# Patient Record
Sex: Male | Born: 1935 | Race: White | Hispanic: No | Marital: Single | Smoking: Former smoker
Health system: Southern US, Community
[De-identification: ages and names within clinical notes are randomized; demographics above are authoritative.]

## PROBLEM LIST (undated history)

## (undated) DIAGNOSIS — G4730 Sleep apnea, unspecified: Secondary | ICD-10-CM

## (undated) DIAGNOSIS — G629 Polyneuropathy, unspecified: Secondary | ICD-10-CM

## (undated) DIAGNOSIS — I10 Essential (primary) hypertension: Secondary | ICD-10-CM

## (undated) DIAGNOSIS — T7840XA Allergy, unspecified, initial encounter: Secondary | ICD-10-CM

## (undated) DIAGNOSIS — J45909 Unspecified asthma, uncomplicated: Secondary | ICD-10-CM

## (undated) DIAGNOSIS — E119 Type 2 diabetes mellitus without complications: Secondary | ICD-10-CM

## (undated) DIAGNOSIS — E7800 Pure hypercholesterolemia, unspecified: Secondary | ICD-10-CM

## (undated) DIAGNOSIS — H409 Unspecified glaucoma: Secondary | ICD-10-CM

## (undated) DIAGNOSIS — Z5189 Encounter for other specified aftercare: Secondary | ICD-10-CM

## (undated) DIAGNOSIS — E079 Disorder of thyroid, unspecified: Secondary | ICD-10-CM

## (undated) DIAGNOSIS — D649 Anemia, unspecified: Secondary | ICD-10-CM

## (undated) DIAGNOSIS — K219 Gastro-esophageal reflux disease without esophagitis: Secondary | ICD-10-CM

## (undated) HISTORY — DX: Unspecified glaucoma: H40.9

## (undated) HISTORY — PX: COLONOSCOPY: SHX174

## (undated) HISTORY — PX: BACK SURGERY: SHX140

## (undated) HISTORY — PX: LEG SURGERY: SHX1003

## (undated) HISTORY — DX: Anemia, unspecified: D64.9

## (undated) HISTORY — DX: Disorder of thyroid, unspecified: E07.9

## (undated) HISTORY — DX: Encounter for other specified aftercare: Z51.89

## (undated) HISTORY — PX: CATARACT EXTRACTION, BILATERAL: SHX1313

## (undated) HISTORY — DX: Pure hypercholesterolemia, unspecified: E78.00

## (undated) HISTORY — DX: Gastro-esophageal reflux disease without esophagitis: K21.9

## (undated) HISTORY — PX: OTHER SURGICAL HISTORY: SHX169

## (undated) HISTORY — DX: Sleep apnea, unspecified: G47.30

## (undated) HISTORY — DX: Essential (primary) hypertension: I10

## (undated) HISTORY — DX: Unspecified asthma, uncomplicated: J45.909

## (undated) HISTORY — DX: Polyneuropathy, unspecified: G62.9

## (undated) HISTORY — DX: Allergy, unspecified, initial encounter: T78.40XA

## (undated) HISTORY — DX: Type 2 diabetes mellitus without complications: E11.9

---

## 1999-09-04 ENCOUNTER — Encounter: Payer: Self-pay | Admitting: Internal Medicine

## 2006-03-17 ENCOUNTER — Encounter: Payer: Self-pay | Admitting: Internal Medicine

## 2009-03-13 ENCOUNTER — Encounter: Payer: Self-pay | Admitting: Internal Medicine

## 2016-07-11 ENCOUNTER — Encounter (INDEPENDENT_AMBULATORY_CARE_PROVIDER_SITE_OTHER): Payer: Self-pay | Admitting: Internal Medicine

## 2016-08-07 ENCOUNTER — Ambulatory Visit (INDEPENDENT_AMBULATORY_CARE_PROVIDER_SITE_OTHER): Payer: Medicare Other | Admitting: Internal Medicine

## 2016-08-08 ENCOUNTER — Ambulatory Visit (INDEPENDENT_AMBULATORY_CARE_PROVIDER_SITE_OTHER): Payer: Medicare Other | Admitting: Internal Medicine

## 2016-08-22 ENCOUNTER — Encounter (INDEPENDENT_AMBULATORY_CARE_PROVIDER_SITE_OTHER): Payer: Self-pay | Admitting: Internal Medicine

## 2016-08-22 ENCOUNTER — Encounter (INDEPENDENT_AMBULATORY_CARE_PROVIDER_SITE_OTHER): Payer: Self-pay

## 2016-08-22 ENCOUNTER — Ambulatory Visit (INDEPENDENT_AMBULATORY_CARE_PROVIDER_SITE_OTHER): Payer: Medicare Other | Admitting: Internal Medicine

## 2016-08-22 VITALS — BP 178/80 | HR 80 | Temp 98.2°F | Ht 71.0 in | Wt 181.3 lb

## 2016-08-22 DIAGNOSIS — E7800 Pure hypercholesterolemia, unspecified: Secondary | ICD-10-CM | POA: Insufficient documentation

## 2016-08-22 DIAGNOSIS — D509 Iron deficiency anemia, unspecified: Secondary | ICD-10-CM | POA: Insufficient documentation

## 2016-08-22 DIAGNOSIS — I10 Essential (primary) hypertension: Secondary | ICD-10-CM | POA: Diagnosis not present

## 2016-08-22 DIAGNOSIS — D508 Other iron deficiency anemias: Secondary | ICD-10-CM

## 2016-08-22 DIAGNOSIS — H409 Unspecified glaucoma: Secondary | ICD-10-CM | POA: Insufficient documentation

## 2016-08-22 NOTE — Progress Notes (Signed)
Subjective:    Patient ID: Duane Kelly, male    DOB: 1932-04-12, 83 y.o.   MRN: [id number]  HPI Referred by Dr. Madelyn Flavors for  anemia. He takes iron daily. He has acid reflux. Occasionally has acid reflux if he eats spicy foods.  He denies any dysphagia. He denies seeing any blood in his stool. There has been no weight loss. Appetite is good.  He has a BM daily and sometimes two.  No melena or BRRB. He says his stools are dark because of the iron.   Per records, last colonoscopy in 2010 and was normal. Dr. Aleene Davidson.  10 year f/u recommended. Diabetic for over 20 yrs.    Review of Systems Past Medical History:  Diagnosis Date  . Diabetes (HCC)   . High blood pressure   . High cholesterol     No past surgical history on file.  Allergies no known allergies  No current outpatient prescriptions on file prior to visit.   No current facility-administered medications on file prior to visit.    Current Outpatient Prescriptions  Medication Sig Dispense Refill  . amLODipine (NORVASC) 5 MG tablet Take 5 mg by mouth daily.    . benazepril-hydrochlorthiazide (LOTENSIN HCT) 20-25 MG tablet Take 1 tablet by mouth daily.    . Biotin 1 MG CAPS Take by mouth.    . budesonide-formoterol (SYMBICORT) 80-4.5 MCG/ACT inhaler Inhale 2 puffs into the lungs 2 (two) times daily.    . Cholecalciferol (VITAMIN D3) 1000 units CAPS Take 5,000 Units by mouth daily.    Marland Kitchen dipyridamole (PERSANTINE) 50 MG tablet Take 50 mg by mouth 4 (four) times daily.    Marland Kitchen DOXAZOSIN MESYLATE ER PO Take 8 mg by mouth.    . ferrous sulfate 325 (65 FE) MG tablet Take 325 mg by mouth daily with breakfast.    . finasteride (PROSCAR) 5 MG tablet Take 5 mg by mouth daily.    Marland Kitchen gabapentin (NEURONTIN) 100 MG capsule Take 100 mg by mouth as needed.    . Garlic 10 MG CAPS Take by mouth as needed.    Marland Kitchen HYDROcodone-acetaminophen (NORCO) 7.5-325 MG tablet Take 1 tablet by mouth every 6 (six) hours as needed for moderate pain.      Marland Kitchen LORazepam (ATIVAN) 1 MG tablet Take 1 mg by mouth 2 (two) times daily.    . metFORMIN (GLUCOPHAGE) 500 MG tablet Take by mouth 3 (three) times daily.    . montelukast (SINGULAIR) 10 MG tablet Take 10 mg by mouth at bedtime.    . Multiple Vitamins-Minerals (CENTRUM SILVER 50+MEN PO) Take by mouth.    Marland Kitchen omeprazole (PRILOSEC) 20 MG capsule Take 20 mg by mouth 2 (two) times daily before a meal.    . potassium chloride SA (KLOR-CON M15) 15 MEQ tablet Take 20 mEq by mouth 2 (two) times daily.    . pravastatin (PRAVACHOL) 20 MG tablet Take 20 mg by mouth daily.    . traMADol (ULTRAM) 50 MG tablet Take by mouth every 6 (six) hours as needed.    . vitamin B-12 (CYANOCOBALAMIN) 100 MCG tablet Take by mouth daily.    Marland Kitchen zolpidem (AMBIEN) 10 MG tablet Take 10 mg by mouth at bedtime as needed for sleep.     No current facility-administered medications for this visit.         Objective:   Physical Exam Blood pressure (!) 178/80, pulse 80, temperature 98.2 F (36.8 C), height 5\' 11"  (1.803 m), weight 181 lb  4.8 oz (82.2 kg).  Alert and oriented. Skin warm and dry. Oral mucosa is moist.   . Sclera anicteric, conjunctivae is pink. Thyroid not enlarged. No cervical lymphadenopathy. Lungs clear. Heart regular rate and rhythm.  Abdomen is soft. Bowel sounds are positive. No hepatomegaly. No abdominal masses felt. No tenderness.  No edema to lower extremities.  Stool brown and guaiac negative      Assessment & Plan:  Anemia. Am going to get iron studies, CBC.  If anemic will need an EGD/Colonoscopy.

## 2016-08-22 NOTE — Patient Instructions (Signed)
Iron studies, CBC. Further recommendations to follow.

## 2016-08-23 LAB — IRON AND TIBC
%SAT: 30 % (ref 15–60)
Iron: 89 ug/dL (ref 50–180)
TIBC: 293 ug/dL (ref 250–425)
UIBC: 204 ug/dL (ref 125–400)

## 2016-08-23 LAB — CBC WITH DIFFERENTIAL/PLATELET
BASOS PCT: 1 %
Basophils Absolute: 63 cells/uL (ref 0–200)
EOS ABS: 126 {cells}/uL (ref 15–500)
Eosinophils Relative: 2 %
HEMATOCRIT: 36.8 % — AB (ref 38.5–50.0)
HEMOGLOBIN: 12.1 g/dL — AB (ref 13.2–17.1)
LYMPHS ABS: 2394 {cells}/uL (ref 850–3900)
LYMPHS PCT: 38 %
MCH: 30.1 pg (ref 27.0–33.0)
MCHC: 32.9 g/dL (ref 32.0–36.0)
MCV: 91.5 fL (ref 80.0–100.0)
MONO ABS: 504 {cells}/uL (ref 200–950)
MPV: 8.8 fL (ref 7.5–12.5)
Monocytes Relative: 8 %
NEUTROS ABS: 3213 {cells}/uL (ref 1500–7800)
Neutrophils Relative %: 51 %
Platelets: 330 10*3/uL (ref 140–400)
RBC: 4.02 MIL/uL — AB (ref 4.20–5.80)
RDW: 13.8 % (ref 11.0–15.0)
WBC: 6.3 10*3/uL (ref 3.8–10.8)

## 2016-08-23 LAB — FERRITIN: FERRITIN: 84 ng/mL (ref 20–380)

## 2016-08-27 ENCOUNTER — Other Ambulatory Visit (INDEPENDENT_AMBULATORY_CARE_PROVIDER_SITE_OTHER): Payer: Self-pay | Admitting: Internal Medicine

## 2016-09-11 ENCOUNTER — Encounter (INDEPENDENT_AMBULATORY_CARE_PROVIDER_SITE_OTHER): Payer: Self-pay | Admitting: Internal Medicine

## 2016-09-11 NOTE — Progress Notes (Signed)
Patient was given an appointment for 12/09/16 at 10:45am with Dorene Arerri Setzer, NP.  A letter was mailed to the patient.

## 2016-09-17 ENCOUNTER — Encounter (INDEPENDENT_AMBULATORY_CARE_PROVIDER_SITE_OTHER): Payer: Self-pay

## 2016-12-09 ENCOUNTER — Ambulatory Visit (INDEPENDENT_AMBULATORY_CARE_PROVIDER_SITE_OTHER): Payer: Medicare Other | Admitting: Internal Medicine

## 2016-12-11 ENCOUNTER — Ambulatory Visit (INDEPENDENT_AMBULATORY_CARE_PROVIDER_SITE_OTHER): Payer: Medicare Other | Admitting: Internal Medicine

## 2018-04-21 ENCOUNTER — Encounter: Payer: Self-pay | Admitting: Internal Medicine

## 2018-04-30 ENCOUNTER — Ambulatory Visit (INDEPENDENT_AMBULATORY_CARE_PROVIDER_SITE_OTHER): Payer: Medicare Other | Admitting: Internal Medicine

## 2018-04-30 ENCOUNTER — Encounter: Payer: Self-pay | Admitting: *Deleted

## 2018-04-30 VITALS — BP 152/72 | HR 88 | Ht 71.0 in | Wt 182.4 lb

## 2018-04-30 DIAGNOSIS — R1310 Dysphagia, unspecified: Secondary | ICD-10-CM | POA: Diagnosis not present

## 2018-04-30 DIAGNOSIS — K219 Gastro-esophageal reflux disease without esophagitis: Secondary | ICD-10-CM

## 2018-04-30 NOTE — Patient Instructions (Signed)
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
## 2018-05-22 HISTORY — PX: ESOPHAGOGASTRODUODENOSCOPY: SHX1529

## 2018-05-28 ENCOUNTER — Ambulatory Visit (AMBULATORY_SURGERY_CENTER): Payer: Medicare Other | Admitting: Internal Medicine

## 2018-05-28 ENCOUNTER — Encounter: Payer: Self-pay | Admitting: Internal Medicine

## 2018-05-28 VITALS — BP 147/74 | HR 75 | Temp 98.2°F | Resp 17 | Ht 71.0 in | Wt 182.0 lb

## 2018-05-28 DIAGNOSIS — K219 Gastro-esophageal reflux disease without esophagitis: Secondary | ICD-10-CM | POA: Diagnosis present

## 2018-05-28 DIAGNOSIS — K449 Diaphragmatic hernia without obstruction or gangrene: Secondary | ICD-10-CM

## 2018-05-28 DIAGNOSIS — K222 Esophageal obstruction: Secondary | ICD-10-CM | POA: Diagnosis not present

## 2018-05-28 DIAGNOSIS — R1310 Dysphagia, unspecified: Secondary | ICD-10-CM | POA: Diagnosis not present

## 2018-05-28 MED ORDER — OMEPRAZOLE 40 MG PO CPDR: 40.0000 mg | DELAYED_RELEASE_CAPSULE | Freq: Every day | ORAL | 3 refills | Status: DC

## 2018-05-28 MED ORDER — SODIUM CHLORIDE 0.9 % IV SOLN: 500.0000 mL | Freq: Once | INTRAVENOUS | Status: AC

## 2018-05-28 NOTE — Patient Instructions (Signed)
HANDOUTS GIVEN FOR GERD/HIATAL HERNIA, POST DILATION DIET  PICK UP NEW PRESCRIPTION  FOLLOW POST DILATION DIET  YOU HAD AN ENDOSCOPIC PROCEDURE TODAY AT THE [facility]:   Refer to the procedure report that was given to you for any specific questions about what was found during the examination.  If the procedure report does not answer your questions, please call your gastroenterologist to clarify.  If you requested that your care partner not be given the details of your procedure findings, then the procedure report has been included in a sealed envelope for you to review at your convenience later.  YOU SHOULD EXPECT: Some feelings of bloating in the abdomen. Passage of more gas than usual.  Walking can help get rid of the air that was put into your GI tract during the procedure and reduce the bloating. If you had a lower endoscopy (such as a colonoscopy or flexible sigmoidoscopy) you may notice spotting of blood in your stool or on the toilet paper. If you underwent a bowel prep for your procedure, you may not have a normal bowel movement for a few days.  Please Note:  You might notice some irritation and congestion in your nose or some drainage.  This is from the oxygen used during your procedure.  There is no need for concern and it should clear up in a day or so.  SYMPTOMS TO REPORT IMMEDIATELY:   Following upper endoscopy (EGD)  Vomiting of blood or coffee ground material  New chest pain or pain under the shoulder blades  Painful or persistently difficult swallowing  New shortness of breath  Fever of 100F or higher  Black, tarry-looking stools  For urgent or emergent issues, a gastroenterologist can be reached at any hour by calling [phone].   DIET:  We do recommend a small meal at first, but then you may proceed to your regular diet.  Drink plenty of fluids but you should avoid alcoholic beverages for 24 hours.  ACTIVITY:  You should plan to take it easy for the  rest of today and you should NOT DRIVE or use heavy machinery until tomorrow (because of the sedation medicines used during the test).    FOLLOW UP: Our staff will call the number listed on your records the next business day following your procedure to check on you and address any questions or concerns that you may have regarding the information given to you following your procedure. If we do not reach you, we will leave a message.  However, if you are feeling well and you are not experiencing any problems, there is no need to return our call.  We will assume that you have returned to your regular daily activities without incident.  If any biopsies were taken you will be contacted by phone or by letter within the next 1-3 weeks.  Please call us at [phone] if you have not heard about the biopsies in 3 weeks.    SIGNATURES/CONFIDENTIALITY: You and/or your care partner have signed paperwork which will be entered into your electronic medical record.  These signatures attest to the fact that that the information above on your After Visit Summary has been reviewed and is understood.  Full responsibility of the confidentiality of this discharge information lies with you and/or your care-partner.

## 2018-05-28 NOTE — Op Note (Signed)
[facility] Patient Name: Duane Kelly Procedure Date: 05/28/2018 1:45 PM MRN: [id number] Endoscopist: Wilhemina Bonito. Marina Goodell , MD Age: 84 Referring MD:  Date of Birth: [birth year] Gender: Male Account #: [id number] Procedure:                Upper GI endoscopy with [facility] dilation of the                            esophagus. 59 French Indications:              Dysphagia, Esophageal reflux Medicines:                Monitored Anesthesia Care Procedure:                Pre-Anesthesia Assessment:                           - Prior to the procedure, a History and Physical                            was performed, and patient medications and                            allergies were reviewed. The patient's tolerance of                            previous anesthesia was also reviewed. The risks                            and benefits of the procedure and the sedation                            options and risks were discussed with the patient.                            All questions were answered, and informed consent                            was obtained. Prior Anticoagulants: The patient has                            taken no previous anticoagulant or antiplatelet                            agents. ASA Grade Assessment: II - A patient with                            mild systemic disease. After reviewing the risks                            and benefits, the patient was deemed in                            satisfactory condition to undergo the procedure.  After obtaining informed consent, the endoscope was                            passed under direct vision. Throughout the                            procedure, the patient's blood pressure, pulse, and                            oxygen saturations were monitored continuously. The                            Endoscope was introduced through the mouth, and                            advanced to the second part of  duodenum. The upper                            GI endoscopy was accomplished without difficulty.                            The patient tolerated the procedure well. Scope In: Scope Out: Findings:                 One benign-appearing, intrinsic moderate stenosis                            was found 38 cm from the incisors. This stenosis                            measured 1.4 cm (inner diameter). The scope was                            withdrawn. Dilation was performed with a Maloney                            dilator with mild resistance at 54 Fr.                           The exam of the esophagus also revealed mild                            esophagitis at the gastroesophageal junction.                           The stomach was normal with a small hiatal hernia                            noted.                           The examined duodenum was normal.                           The cardia and gastric fundus  were normal on                            retroflexion. Complications:            No immediate complications. Estimated Blood Loss:     Estimated blood loss: none. Impression:               - Benign-appearing esophageal stenosis. Dilated.                           - Normal stomach.                           - Normal examined duodenum.                           - No specimens collected. Recommendation:           1. Take omeprazole 40 mg daily. Make sure the                            patient has an up-to-date prescription. #30; 11                            refills. He should take this EVERY DAY                           2. Post dilation diet                           3. Chew food well                           4. Office follow-up with Dr. Marina Goodell in about 6 to 8                            weeks. Wilhemina Bonito. Marina Goodell, MD 05/28/2018 2:00:00 PM This report has been signed electronically.

## 2018-05-28 NOTE — Progress Notes (Signed)
Called to room to assist during endoscopic procedure.  Patient ID and intended procedure confirmed with present staff. Received instructions for my participation in the procedure from the performing physician.  

## 2018-05-28 NOTE — Progress Notes (Signed)
Report to PACU, RN, vss, BBS= Clear.  

## 2018-05-28 NOTE — Progress Notes (Addendum)
Patient has a full leg brace, metal and velcro, on his right leg. Discussed with Dr Marina Goodell, patient can leave brace on for this procedure  Patient's last dose Perstantine on 05/26/18, informed Dr Marina Goodell.

## 2018-05-29 ENCOUNTER — Telehealth: Payer: Self-pay

## 2018-05-29 NOTE — Telephone Encounter (Signed)
  Follow up Call-  Call back number 05/28/2018  Post procedure Call Back phone  # [phone]  Permission to leave phone message Yes     Patient questions:  Do you have a fever, pain , or abdominal swelling? No. Pain Score  0 *  Have you tolerated food without any problems? Yes.    Have you been able to return to your normal activities? Yes.    Do you have any questions about your discharge instructions: Diet   No. Medications  No. Follow up visit  No.  Do you have questions or concerns about your Care? No.  Actions: * If pain score is 4 or above: No action needed, pain <4.

## 2018-07-23 ENCOUNTER — Ambulatory Visit: Payer: Medicare Other | Admitting: Internal Medicine

## 2018-07-28 ENCOUNTER — Encounter: Payer: Self-pay | Admitting: Nurse Practitioner

## 2018-07-28 ENCOUNTER — Other Ambulatory Visit (INDEPENDENT_AMBULATORY_CARE_PROVIDER_SITE_OTHER): Payer: Medicare Other

## 2018-07-28 ENCOUNTER — Encounter (INDEPENDENT_AMBULATORY_CARE_PROVIDER_SITE_OTHER): Payer: Self-pay

## 2018-07-28 ENCOUNTER — Ambulatory Visit (INDEPENDENT_AMBULATORY_CARE_PROVIDER_SITE_OTHER): Payer: Medicare Other | Admitting: Nurse Practitioner

## 2018-07-28 VITALS — BP 156/90 | HR 90 | Ht 71.0 in | Wt 181.0 lb

## 2018-07-28 DIAGNOSIS — K219 Gastro-esophageal reflux disease without esophagitis: Secondary | ICD-10-CM | POA: Diagnosis not present

## 2018-07-28 DIAGNOSIS — D509 Iron deficiency anemia, unspecified: Secondary | ICD-10-CM

## 2018-07-28 DIAGNOSIS — D649 Anemia, unspecified: Secondary | ICD-10-CM | POA: Diagnosis not present

## 2018-07-28 DIAGNOSIS — K222 Esophageal obstruction: Secondary | ICD-10-CM

## 2018-07-28 LAB — BASIC METABOLIC PANEL
BUN: 21 mg/dL (ref 6–23)
CO2: 31 mEq/L (ref 19–32)
Calcium: 10.3 mg/dL (ref 8.4–10.5)
Chloride: 102 mEq/L (ref 96–112)
Creatinine, Ser: 1.26 mg/dL (ref 0.40–1.50)
GFR: 57.84 mL/min — ABNORMAL LOW (ref >60.00)
Glucose, Bld: 136 mg/dL — ABNORMAL HIGH (ref 70–99)
Potassium: 3.8 mEq/L (ref 3.5–5.1)
Sodium: 140 mEq/L (ref 135–145)

## 2018-07-28 LAB — CBC
HCT: 40 % (ref 39.0–52.0)
Hemoglobin: 13.1 g/dL (ref 13.0–17.0)
MCHC: 32.7 g/dL (ref 30.0–36.0)
MCV: 90.5 fl (ref 78.0–100.0)
Platelets: 310 10*3/uL (ref 150.0–400.0)
RBC: 4.42 Mil/uL (ref 4.22–5.81)
RDW: 13.9 % (ref 11.5–15.5)
WBC: 7.4 10*3/uL (ref 4.0–10.5)

## 2018-07-28 LAB — FERRITIN: Ferritin: 62 ng/mL (ref 22.0–322.0)

## 2018-07-28 LAB — VITAMIN B12: Vitamin B-12: 359 pg/mL (ref 211–911)

## 2018-07-28 LAB — FOLATE: Folate: >24 ng/mL (ref >5.9)

## 2018-07-28 MED ORDER — OMEPRAZOLE 40 MG PO CPDR: 40.0000 mg | DELAYED_RELEASE_CAPSULE | Freq: Every day | ORAL | 3 refills | Status: DC

## 2018-07-28 NOTE — Progress Notes (Signed)
Chief Complaint: follow up post-EGD    IMPRESSION and PLAN:     1. 85 yo male with GERD / benign esophageal stricture s/p Maloney dilation early November. Regurgitation / pyrosis and dysphagia have all resolved after increasing Omeprazole to 40 mg daily.   -continue daily PPI.   2. Colon cancer screening. Last colonoscopy nearly 10 years ago (normal). Given age there isn't need for ongoing screening colonoscopies.  We discussed screening guidelines and rationale behind those guidelines but patient's wife expressing strong interest in continuing screening colonoscopy. Patient wouldn't be due anyway until August so will defer further discussion until around that time.   3. Hx of [place] anemia. Hgb 12.1 on last EMR labs Feb 2018. His supplemental iron was recently increased by PCP. Wife concerned about the chronic anemia. Stools sometimes dark on iron.  -explained to patient and wife that there are several potential causes of anemia (B12 / folate def. / bone marrow suppression /  CKD / iron loss / malabsorption).  -I will check iron studies to evaluate recent increase in oral iron. Will also obtain CBC. If studies are normal then would continue iron and ask that PCP continue to monitor. Tried to reassure wife that patient has been anemic for years, dating back to last colonoscopy in 2010    HPI:     Patient is an 85 yo male with DM, HTN, and GERD. He is known to Dr. Marina Goodell, last seen mild October 2019 for solid food dysphagia and GERD symptoms despite Omeprazole (?OTC) as needed. For evaluation he underwent EGD  EGD 05/29/18 Benign-appearing esophageal stenosis. Dilated. - Normal stomach. - Normal examined duodenum. - No specimens collected. Impression: 1. Take omeprazole 40 mg daily. Make sure the patient has an up-to-date prescription. #30; 11 refills. He should take this EVERY DAY 2. Post dilation diet 3. Chew food well 4. Office follow-up with Dr. Marina Goodell in about 6 to 8  weeks  Patient is here with his wife for follow-up post EGD.  His original appointment needed to be rescheduled to today.  No further problems with dysphagia and no breakthrough GERD sx since we increased omeprazole to 40 mg daily.   Wife inquiring about screening colonoscopy. Patient isn't having any bowel changes or blood in stool. His stools are often dark on iron. His last colonoscopy almost 10 years ago was normal. Patient's iron supplement was recently increased by PCP.    Review of systems:     No chest pain, no SOB, no fevers, no urinary sx   Past Medical History:  Diagnosis Date  . Allergy   . Anemia   . Asthma   . Blood transfusion without reported diagnosis   . Diabetes (HCC)   . GERD (gastroesophageal reflux disease)   . Glaucoma   . High blood pressure   . High cholesterol   . Neuropathy    legs  . Sleep apnea   . Thyroid disease     Patient's surgical history, family medical history, social history, medications and allergies were all reviewed in Epic   Creatinine clearance cannot be calculated (No successful lab value found.)  Current Outpatient Medications  Medication Sig Dispense Refill  . AMBULATORY NON FORMULARY MEDICATION Medication Name: Fish Oil 3 6 9- Take 1 capsule daily    . amLODipine (NORVASC) 5 MG tablet Take 5 mg by mouth daily.    . Ascorbic Acid (VITAMIN C) 1000 MG tablet Take 1,000 mg by mouth daily.    Marland Kitchen  benazepril-hydrochlorthiazide (LOTENSIN HCT) 20-25 MG tablet Take 1 tablet by mouth daily.    . Biotin 1 MG CAPS Take by mouth.    . budesonide-formoterol (SYMBICORT) 80-4.5 MCG/ACT inhaler Inhale 2 puffs into the lungs 2 (two) times daily.    . Calcium Carbonate-Vit D-Min (CALCIUM 1200 PO) Take 1 tablet by mouth daily.    Marland Kitchen dipyridamole (PERSANTINE) 50 MG tablet Take 50 mg by mouth daily.     . ferrous sulfate 325 (65 FE) MG tablet Take 325 mg by mouth 2 (two) times daily.     Marland Kitchen gabapentin (NEURONTIN) 100 MG capsule Take 100 mg by mouth as  needed.    . Garlic 10 MG CAPS Take by mouth as needed.    . Glucosamine-Chondroitin (OSTEO BI-FLEX REGULAR STRENGTH PO) Take 1 tablet by mouth daily.    Marland Kitchen HYDROcodone-acetaminophen (NORCO) 7.5-325 MG tablet Take 1 tablet by mouth every 6 (six) hours as needed for moderate pain (alternated with tramadol).     Marland Kitchen latanoprost (XALATAN) 0.005 % ophthalmic solution 1 drop at bedtime.    Marland Kitchen levothyroxine (SYNTHROID, LEVOTHROID) 25 MCG tablet Take 25 mcg by mouth daily before breakfast.    . LORazepam (ATIVAN) 1 MG tablet Take 1 mg by mouth 2 (two) times daily as needed.     . metFORMIN (GLUCOPHAGE) 500 MG tablet Take 1,000 mg by mouth 2 (two) times daily.     . montelukast (SINGULAIR) 10 MG tablet Take 10 mg by mouth at bedtime.    . Multiple Vitamins-Minerals (CENTRUM SILVER 50+MEN PO) Take by mouth.    . Multiple Vitamins-Minerals (OCUVITE ADULT 50+ PO) Take 1 tablet by mouth daily.    Marland Kitchen omeprazole (PRILOSEC) 40 MG capsule Take 1 capsule (40 mg total) by mouth daily. 90 capsule 3  . potassium chloride SA (K-DUR,KLOR-CON) 20 MEQ tablet Take 40 mEq by mouth 2 (two) times daily.    . pravastatin (PRAVACHOL) 20 MG tablet Take 20 mg by mouth daily.    . traMADol (ULTRAM) 50 MG tablet Take by mouth every 6 (six) hours as needed (alternated with hydrocodone).     . vitamin B-12 (CYANOCOBALAMIN) 1000 MCG tablet Take 1,000 mcg by mouth daily.    Marland Kitchen zolpidem (AMBIEN) 10 MG tablet Take 10 mg by mouth at bedtime as needed for sleep.     Current Facility-Administered Medications  Medication Dose Route Frequency Provider Last Rate Last Dose  . 0.9 %  sodium chloride infusion  500 mL Intravenous Once Hilarie Fredrickson, MD        Physical Exam:     BP (!) 156/90   Pulse 90   Ht 5\' 11"  (1.803 m)   Wt 181 lb (82.1 kg)   BMI 25.24 kg/m   GENERAL:  Pleasant male in NAD PSYCH: : Cooperative, normal affect EENT:  conjunctiva pink, mucous membranes moist, neck supple without masses CARDIAC:  RRR, no murmur heard,  no peripheral edema PULM: Normal respiratory effort, lungs CTA bilaterally, no wheezing ABDOMEN:  Nondistended, soft, nontender. No obvious masses, no hepatomegaly,  normal bowel sounds Musculoskeletal:  RLE with metal brace NEURO: Alert and oriented x 3  I spent 25 minutes of face-to-face time with the patient. Greater than 50% of the time was spent counseling and coordinating care. Questions answered   Willette Cluster , NP 07/28/2018, 2:20 PM

## 2018-07-28 NOTE — Patient Instructions (Signed)
If you are age 83 or older, your body mass index should be between 23-30. Your Body mass index is 25.24 kg/m. If this is out of the aforementioned range listed, please consider follow up with your Primary Care Provider.  If you are age 65 or younger, your body mass index should be between 19-25. Your Body mass index is 25.24 kg/m. If this is out of the aformentioned range listed, please consider follow up with your Primary Care Provider.   Your provider has requested that you go to the basement level for lab work before leaving today. Press "B" on the elevator. The lab is located at the first door on the left as you exit the elevator.  We have sent the following medications to your pharmacy for you to pick up at your convenience: Prilosec  We will call you with results.  Thank you for choosing me and [facility].   Willette Cluster, NP

## 2018-07-28 NOTE — Progress Notes (Signed)
Assessment and plan noted ?

## 2018-07-29 LAB — IRON, TOTAL/TOTAL IRON BINDING CAP
%SAT: 17 % (calc) — ABNORMAL LOW (ref 20–48)
Iron: 49 ug/dL — ABNORMAL LOW (ref 50–180)
TIBC: 285 mcg/dL (calc) (ref 250–425)

## 2018-07-31 ENCOUNTER — Telehealth: Payer: Self-pay | Admitting: Internal Medicine

## 2018-07-31 NOTE — Telephone Encounter (Signed)
Notes recorded by Meredith Pel, NP on 07/30/2018 at 9:26 AM EST Beth, please call patient and/or wife. On iron which does interfere with interpretation but his hemoglobin is normal, TIBC in low normal range and RDW is normal speaking against iron deficiency.  Would recommend PCP continue to monitor labs and adjust iron iron supplements accordingly.   Left message on machine to call back

## 2018-07-31 NOTE — Telephone Encounter (Signed)
Duane Kelly is returning a call for pt.

## 2018-08-04 NOTE — Telephone Encounter (Signed)
Call was not returned, will await further communication from the pt.

## 2019-08-16 ENCOUNTER — Other Ambulatory Visit: Payer: Self-pay | Admitting: Nurse Practitioner

## 2019-08-16 DIAGNOSIS — K219 Gastro-esophageal reflux disease without esophagitis: Secondary | ICD-10-CM

## 2021-01-23 ENCOUNTER — Emergency Department (HOSPITAL_COMMUNITY)
Admission: EM | Admit: 2021-01-23 | Discharge: 2021-01-23 | Disposition: A | Discharge status: Home / Self Care | Payer: Medicare Other | Attending: Emergency Medicine | Admitting: Emergency Medicine

## 2021-01-23 ENCOUNTER — Emergency Department (HOSPITAL_COMMUNITY): Payer: Medicare Other

## 2021-01-23 ENCOUNTER — Encounter (HOSPITAL_COMMUNITY): Payer: Self-pay

## 2021-01-23 ENCOUNTER — Other Ambulatory Visit: Payer: Self-pay

## 2021-01-23 DIAGNOSIS — Z7984 Long term (current) use of oral hypoglycemic drugs: Secondary | ICD-10-CM | POA: Insufficient documentation

## 2021-01-23 DIAGNOSIS — I10 Essential (primary) hypertension: Secondary | ICD-10-CM | POA: Diagnosis not present

## 2021-01-23 DIAGNOSIS — Z79899 Other long term (current) drug therapy: Secondary | ICD-10-CM | POA: Diagnosis not present

## 2021-01-23 DIAGNOSIS — S43101A Unspecified dislocation of right acromioclavicular joint, initial encounter: Secondary | ICD-10-CM | POA: Diagnosis not present

## 2021-01-23 DIAGNOSIS — Z87891 Personal history of nicotine dependence: Secondary | ICD-10-CM | POA: Insufficient documentation

## 2021-01-23 DIAGNOSIS — W01198A Fall on same level from slipping, tripping and stumbling with subsequent striking against other object, initial encounter: Secondary | ICD-10-CM | POA: Insufficient documentation

## 2021-01-23 DIAGNOSIS — J45909 Unspecified asthma, uncomplicated: Secondary | ICD-10-CM | POA: Diagnosis not present

## 2021-01-23 DIAGNOSIS — E119 Type 2 diabetes mellitus without complications: Secondary | ICD-10-CM | POA: Insufficient documentation

## 2021-01-23 DIAGNOSIS — S4991XA Unspecified injury of right shoulder and upper arm, initial encounter: Secondary | ICD-10-CM | POA: Diagnosis present

## 2021-01-23 DIAGNOSIS — S43102A Unspecified dislocation of left acromioclavicular joint, initial encounter: Secondary | ICD-10-CM

## 2021-01-23 DIAGNOSIS — Z7951 Long term (current) use of inhaled steroids: Secondary | ICD-10-CM | POA: Diagnosis not present

## 2021-01-23 NOTE — Discharge Instructions (Addendum)
Please read and follow all provided instructions.  You have been seen today after an injury to your left shoulder.  Your xray shows separation of your acromioclavicular joint- a joint in your shoulder.  We have placed you in a sling, please wear this at all times until you have followed up with orthopedics.  Do not put any weight on this extremity Please call orthopedic surgery tomorrow to schedule an appointment within the next 3 days.   Home care instructions: -- *PRICE in the first 24-48 hours after injury: Protect with sling Rest Ice- Do not apply ice pack directly to your splint place towel or similar between your splint and ice/ice pack. Apply ice for 20 min, then remove for 40 min while awake Compression- sling Elevate- N/A.   Medications:  Please take tylenol per over the counter dosing as needed for pain.   Follow-up instructions: Please follow-up with the orthopedic surgeon in your discharge instructions  Return instructions:  Please return if your digits or extremity are numb or tingling, appear gray or blue, or you have severe pain (also elevate the extremity and loosen splint or wrap if you were given one) Please return if you have redness or fevers.  Please return to the Emergency Department if you experience worsening symptoms.  Please return if you have any other emergent concerns. Additional Information:  Your vital signs today were: BP (!) 170/76   Pulse 99   Resp 17   Ht 5\' 11"  (1.803 m)   Wt 81.6 kg   SpO2 95%   BMI 25.10 kg/m  If your blood pressure (BP) was elevated above 135/85 this visit, please have this repeated by your doctor within one month. ---------------

## 2021-01-23 NOTE — ED Notes (Signed)
Per RN put pt in a mobile sling for left fore arm
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
## 2021-01-23 NOTE — ED Triage Notes (Signed)
Pt presents to ED with complaints of left shoulder pain after falling this morning and hitting it on the refrigerator. Pt denies LOC or hitting head.

## 2021-01-24 ENCOUNTER — Encounter: Payer: Self-pay | Admitting: Orthopedic Surgery

## 2021-01-24 ENCOUNTER — Ambulatory Visit (INDEPENDENT_AMBULATORY_CARE_PROVIDER_SITE_OTHER): Payer: Medicare Other | Admitting: Orthopedic Surgery

## 2021-01-24 VITALS — BP 130/64 | HR 86

## 2021-01-24 DIAGNOSIS — W010XXA Fall on same level from slipping, tripping and stumbling without subsequent striking against object, initial encounter: Secondary | ICD-10-CM

## 2021-01-24 DIAGNOSIS — S4992XA Unspecified injury of left shoulder and upper arm, initial encounter: Secondary | ICD-10-CM | POA: Diagnosis not present
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
## 2021-01-24 NOTE — Patient Instructions (Addendum)
Keep sling on at all times for the next 2 weeks.  OK to remove for hygiene.  Use a shower chair or sponge bath only.  IN 2 weeks, ok to remove for light use of the arm.    DO NOT bear weight on your left arm until you return to clinic in 4 weeks.  You can use ice (with a towel) or voltaren gel (available over the counter) directly on the injured part of your shoulder.  IN another week, come out of the sling 2-3 times per day to initiate this exercise  Pendulum exercise  To do this exercise while sitting: Sit in a chair or at the edge of your bed with your feet flat on the floor. Let your affected arm hang down in front of you over the edge of the bed or chair. Relax your shoulder, arm, and hand. Rock your body so your arm gently swings in small circles. You can also use your unaffected arm to start the motion. Repeat changing the direction of the circles, swinging your arm left and right, and swinging your arm forward and back. To do this exercise while standing: Stand next to a sturdy chair or table, and hold on to it with your hand on your unaffected side. Bend forward at the waist. Bend your knees slightly. Relax your shoulder, arm, and hand. While keeping your shoulder relaxed, use body motion to swing your arm in small circles. Repeat changing the direction of the circles, swinging your arm left and right, and swinging your arm forward and back. Between exercises, stand up tall and take a short break to relax your lower back.

## 2021-01-26 ENCOUNTER — Ambulatory Visit: Payer: Medicare Other | Admitting: Orthopedic Surgery

## 2021-02-21 ENCOUNTER — Ambulatory Visit (INDEPENDENT_AMBULATORY_CARE_PROVIDER_SITE_OTHER): Payer: Medicare Other | Admitting: Orthopedic Surgery

## 2021-02-21 ENCOUNTER — Other Ambulatory Visit: Payer: Self-pay

## 2021-02-21 ENCOUNTER — Ambulatory Visit: Payer: Medicare Other

## 2021-02-21 ENCOUNTER — Encounter: Payer: Self-pay | Admitting: Orthopedic Surgery

## 2021-02-21 VITALS — Ht 71.0 in | Wt 180.0 lb

## 2021-02-21 DIAGNOSIS — S4992XD Unspecified injury of left shoulder and upper arm, subsequent encounter: Secondary | ICD-10-CM

## 2021-02-21 NOTE — Patient Instructions (Signed)

## 2021-02-21 NOTE — Progress Notes (Signed)
Orthopaedic Clinic Return  Assessment: Duane Kelly is a 87 y.o. male with the following: Left AC joint separation; treated without surgery  Plan: Patient is doing very well at today's visit.  He has no pain.  He has near full range of motion.  He has not started to use his left arm for many activities, as of yet.  He has minimal tenderness to palpation overlying the clavicle, with attempted reduction.  Radiographs are stable.  He does not need to use the sling anymore.  He can gradually increase the level of his activity.  I advised him to gradually return to his usual activities, and be very cognizant of trying the left heavier objects at this point.  No follow-up is needed, but they can return to clinic if they have any questions or concerns.   Follow-up: Return if symptoms worsen or fail to improve.   Subjective:  Chief Complaint  Patient presents with   Shoulder Injury    DOI 01/22/21     History of Present Illness: Duane Kelly is a 87 y.o. male who returns to clinic for repeat evaluation of left shoulder pain.  Approximately 1 month ago, he fell and sustained an injury to his left [facility] joint.  He has been using a sling up until about a week ago.  Since then, he is gradually using his left arm for more activities.  Minimal pain in the left shoulder.  He has not been lifting heavier objects.  No numbness or tingling distally.  Review of Systems: No fevers or chills No numbness or tingling No chest pain No shortness of breath No bowel or bladder dysfunction No GI distress No headaches     Objective: Ht 5\' 11"  (1.803 m)   Wt 180 lb (81.6 kg)   BMI 25.10 kg/m   Physical Exam:  Elderly male, sitting in a wheelchair.  Evaluation left shoulder demonstrates obvious deformity at the [facility] joint.  Minimal tenderness to palpation over the distal clavicle.  I am able to partially reduce the distal clavicle, although this does return to a displaced position.  He tolerates this with  minimal discomfort.  150 degrees of forward flexion.  90 degrees of abduction.  Good deltoid strength.  No pain in the left shoulder with strength testing.  Fingers are warm and well-perfused.  IMAGING: I personally ordered and reviewed the following images:  X-rays of the left shoulder demonstrates a persistent displacement of the clavicle, with greater than 100% displacement in relation to the acromion.  No acute injuries are noted.  No interval displacement, compared to previous radiographs.  Impression: Stable left AC joint separation  [facility], MD 02/21/2021 11:01 PM

## 2022-08-18 IMAGING — DX DG SHOULDER 2+V*L*
3 series · 3 of 3 positions shown · non-contrast
Comparison: No prior.

CLINICAL DATA: Fall.  Left shoulder pain.

EXAM:
LEFT SHOULDER - 2+ VIEW

[shoulder axial]
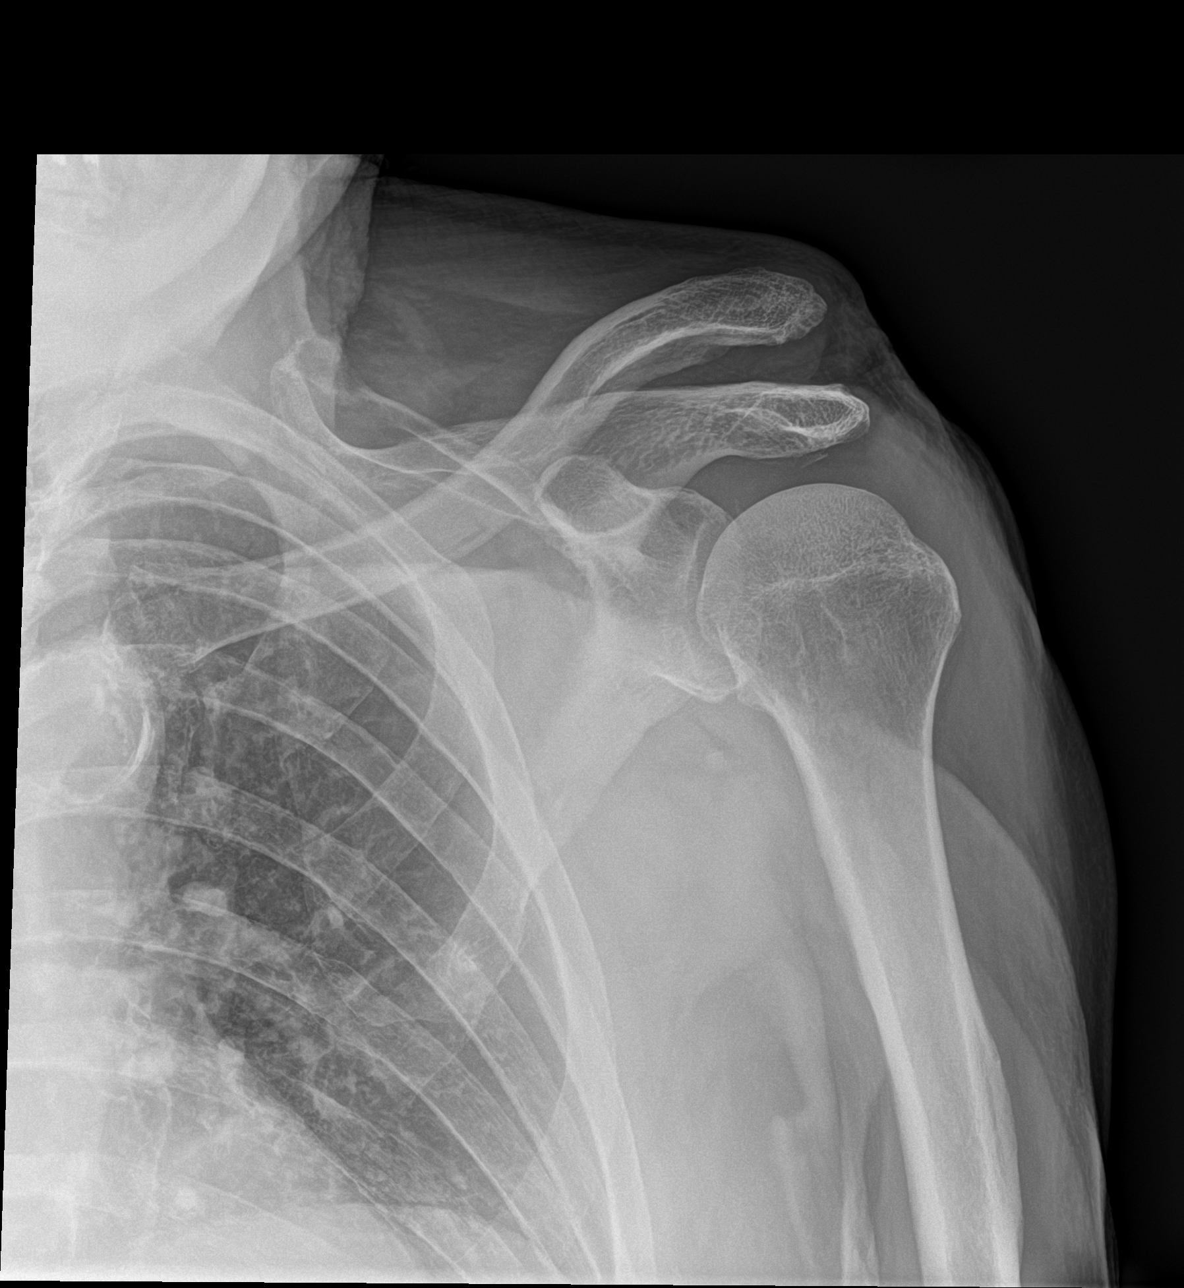

[shoulder swimmer]
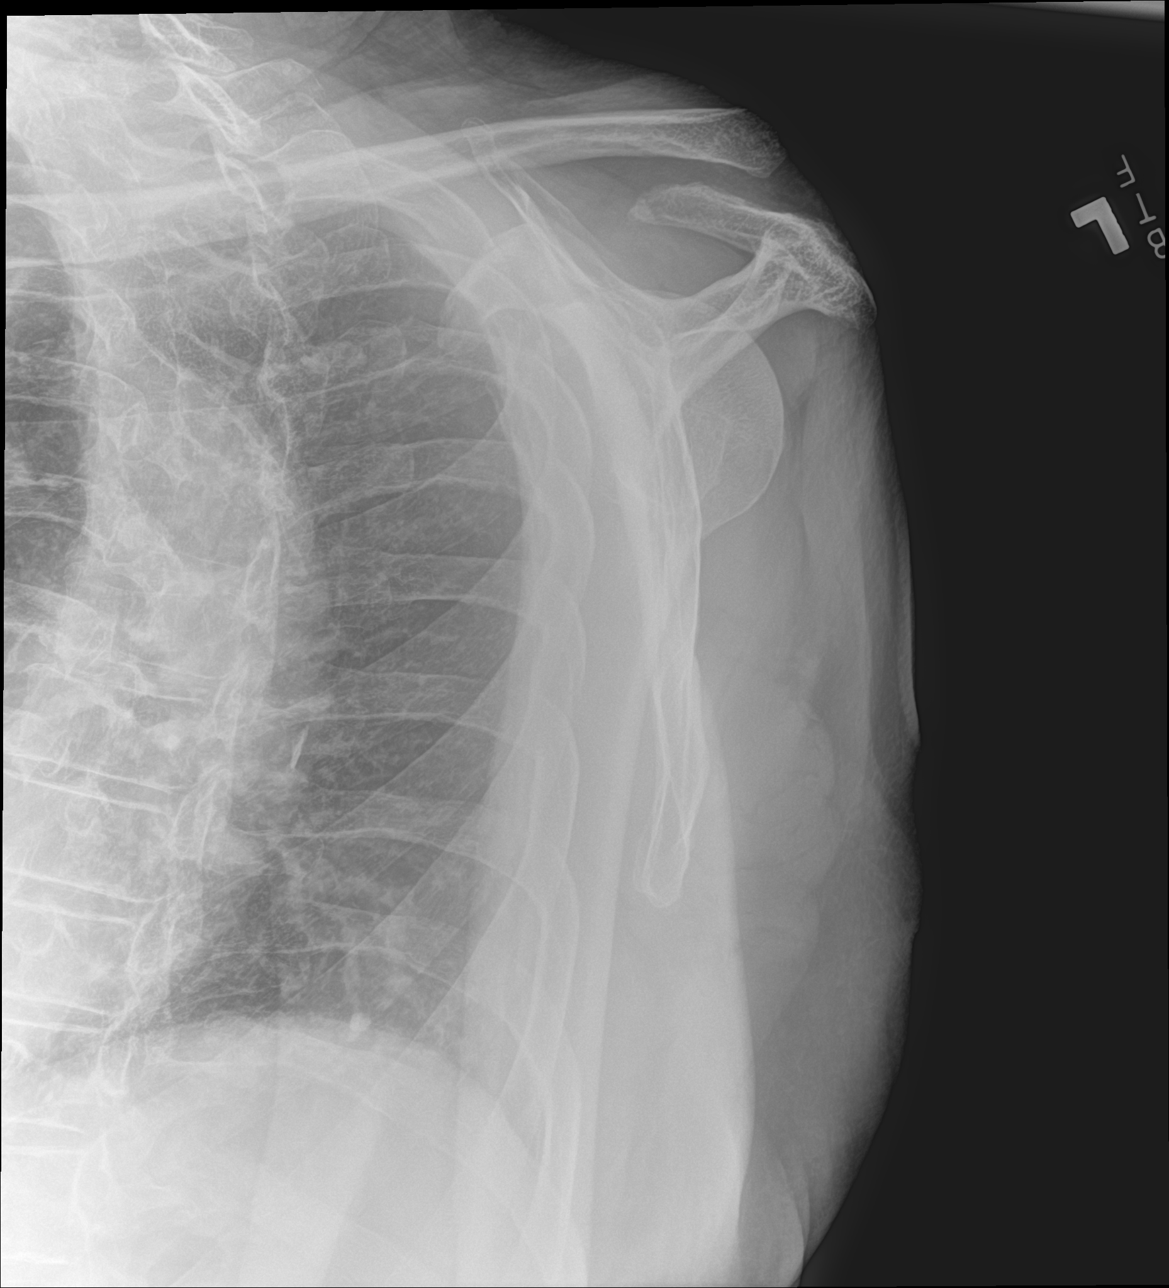

[shoulder ap]
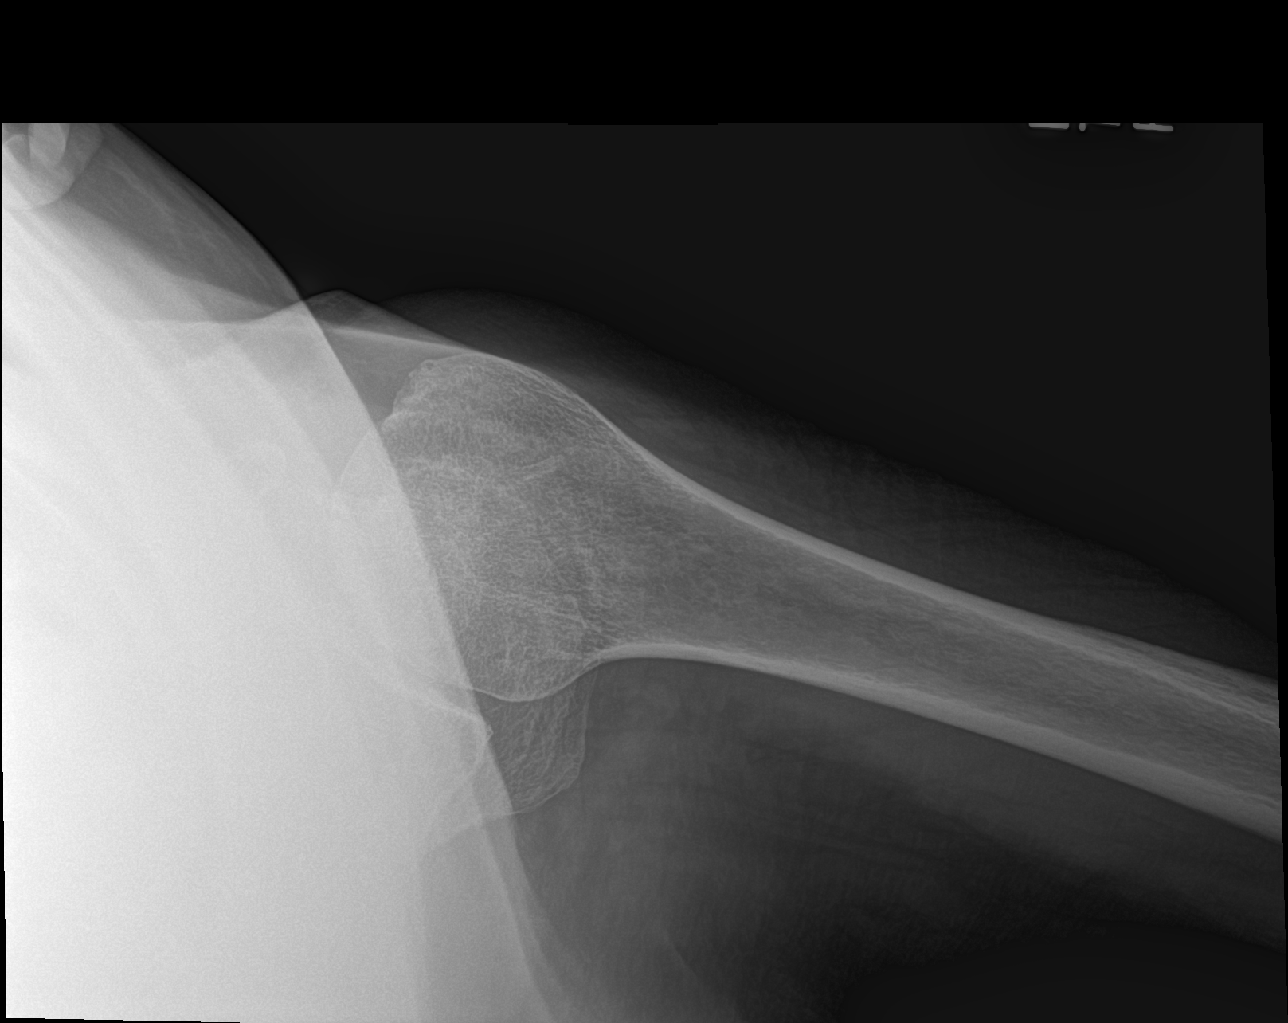

[3 of 3 positions shown; findings below may reference images not displayed]

FINDINGS: Acromioclavicular separation appears to be present. No evidence of
fracture or dislocation. Glenohumeral degenerative change. Mild
subacromial spurring.
IMPRESSION: 1. Left acromioclavicular separation. No evidence of fracture or
dislocation.

2.  Glenohumeral degenerative change.  Mild subacromial spurring.

## 2022-09-19 ENCOUNTER — Encounter: Payer: Self-pay | Admitting: Radiology
